# Patient Record
Sex: Female | Born: 1990 | Race: White | Hispanic: No | Marital: Single | Smoking: Never smoker
Health system: Southern US, Community
[De-identification: ages and names within clinical notes are randomized; demographics above are authoritative.]

## PROBLEM LIST (undated history)

## (undated) HISTORY — PX: CHOLECYSTECTOMY: SHX55

## (undated) HISTORY — PX: TUBAL LIGATION: SHX77

---

## 2021-03-09 ENCOUNTER — Other Ambulatory Visit: Payer: Self-pay

## 2021-03-09 ENCOUNTER — Encounter (HOSPITAL_BASED_OUTPATIENT_CLINIC_OR_DEPARTMENT_OTHER): Payer: Self-pay | Admitting: *Deleted

## 2021-03-09 ENCOUNTER — Emergency Department (HOSPITAL_BASED_OUTPATIENT_CLINIC_OR_DEPARTMENT_OTHER)
Admission: EM | Admit: 2021-03-09 | Discharge: 2021-03-10 | Disposition: A | Discharge status: Home / Self Care | Payer: Self-pay | Attending: Emergency Medicine | Admitting: Emergency Medicine

## 2021-03-09 ENCOUNTER — Emergency Department (HOSPITAL_BASED_OUTPATIENT_CLINIC_OR_DEPARTMENT_OTHER): Payer: Self-pay

## 2021-03-09 DIAGNOSIS — N939 Abnormal uterine and vaginal bleeding, unspecified: Secondary | ICD-10-CM | POA: Insufficient documentation

## 2021-03-09 DIAGNOSIS — R1030 Lower abdominal pain, unspecified: Secondary | ICD-10-CM | POA: Insufficient documentation

## 2021-03-09 DIAGNOSIS — R102 Pelvic and perineal pain: Secondary | ICD-10-CM | POA: Insufficient documentation

## 2021-03-09 LAB — WET PREP, GENITAL
Clue Cells Wet Prep HPF POC: NONE SEEN
Sperm: NONE SEEN
Trich, Wet Prep: NONE SEEN
Yeast Wet Prep HPF POC: NONE SEEN

## 2021-03-09 LAB — URINALYSIS, MICROSCOPIC (REFLEX): RBC / HPF: >50 RBC/hpf (ref 0–5)

## 2021-03-09 LAB — URINALYSIS, ROUTINE W REFLEX MICROSCOPIC

## 2021-03-09 LAB — PREGNANCY, URINE: Preg Test, Ur: NEGATIVE

## 2021-03-09 NOTE — ED Triage Notes (Signed)
Heavy vaginal bleeding x 84 days. Stopped bleeding x 4 days then started again. States she lives in [place]. Hx of abnormal vaginal bleeding that her MD is aware of. She has an [place] scheduled for a month.

## 2021-03-09 NOTE — ED Provider Notes (Signed)
[facility] EMERGENCY DEPARTMENT Provider Note   CSN: [id number] Arrival date & time: 03/09/21  2008     History Chief Complaint  Patient presents with   Vaginal Bleeding    Ashley Walsh is a 30 y.o. female presenting to the ED with a chief complaint of vaginal bleeding.  States that 4 days ago she started having heavy vaginal bleeding that has worsened since it began.  4 days prior to this she had no bleeding but 84 days prior to this she has had persistent vaginal bleeding.  She is seen her specialist several times but "they have not really done anything or started me on anything.  I am scheduled for an ultrasound in a month."  She states that she is going through several pads every few hours.  Reports some lower abdominal cramping.  Denies any vomiting, anticoagulant use, possibility of pregnancy.  No vaginal discharge.   Vaginal Bleeding Associated symptoms: no abdominal pain, no dizziness, no dysuria, no fever and no nausea       History reviewed. No pertinent past medical history.  There are no problems to display for this patient.   Past Surgical History:  Procedure Laterality Date   CESAREAN SECTION     CHOLECYSTECTOMY     TUBAL LIGATION       OB History   No obstetric history on file.     No family history on file.  Social History   Tobacco Use   Smoking status: Never   Smokeless tobacco: Never  Vaping Use   Vaping Use: Never used  Substance Use Topics   Alcohol use: Not Currently   Drug use: Never    Home Medications Prior to Admission medications   Not on File    Allergies    Percocet [oxycodone-acetaminophen]  Review of Systems   Review of Systems  Constitutional:  Negative for appetite change, chills and fever.  HENT:  Negative for ear pain, rhinorrhea, sneezing and sore throat.   Eyes:  Negative for photophobia and visual disturbance.  Respiratory:  Negative for cough, chest tightness, shortness of breath and wheezing.    Cardiovascular:  Negative for chest pain and palpitations.  Gastrointestinal:  Negative for abdominal pain, blood in stool, constipation, diarrhea, nausea and vomiting.  Genitourinary:  Positive for pelvic pain and vaginal bleeding. Negative for dysuria, hematuria and urgency.  Musculoskeletal:  Negative for myalgias.  Skin:  Negative for rash.  Neurological:  Negative for dizziness, weakness and light-headedness.   Physical Exam Updated Vital Signs BP 125/84 (BP Location: Right Arm)   Pulse (!) 109   Temp 99.7 F (37.6 C) (Oral)   Resp 17   Ht 5\' 7"  (1.702 m)   Wt 101.4 kg   SpO2 100%   BMI 35.01 kg/m   Physical Exam Vitals and nursing note reviewed.  Constitutional:      General: She is not in acute distress.    Appearance: She is well-developed.  HENT:     Head: Normocephalic and atraumatic.     Nose: Nose normal.  Eyes:     General: No scleral icterus.       Right eye: No discharge.        Left eye: No discharge.     Conjunctiva/sclera: Conjunctivae normal.  Cardiovascular:     Rate and Rhythm: Regular rhythm. Tachycardia present.     Heart sounds: Normal heart sounds. No murmur heard.   No friction rub. No gallop.  Pulmonary:  Effort: Pulmonary effort is normal. No respiratory distress.     Breath sounds: Normal breath sounds.  Abdominal:     General: Bowel sounds are normal. There is no distension.     Palpations: Abdomen is soft.     Tenderness: There is no abdominal tenderness. There is no guarding.  Genitourinary:    Vagina: Bleeding present. No vaginal discharge or tenderness.     Cervix: No cervical motion tenderness or erythema.     Uterus: Tender.   Musculoskeletal:        General: Normal range of motion.     Cervical back: Normal range of motion and neck supple.  Skin:    General: Skin is warm and dry.     Findings: No rash.  Neurological:     Mental Status: She is alert.     Motor: No abnormal muscle tone.     Coordination: Coordination  normal.    ED Results / Procedures / Treatments   Labs (all labs ordered are listed, but only abnormal results are displayed) Labs Reviewed  WET PREP, GENITAL - Abnormal; Notable for the following components:      Result Value   WBC, Wet Prep HPF POC FEW (*)    All other components within normal limits  URINALYSIS, ROUTINE W REFLEX MICROSCOPIC - Abnormal; Notable for the following components:   Color, Urine RED (*)    APPearance TURBID (*)    Glucose, UA   (*)    Value: TEST NOT REPORTED DUE TO COLOR INTERFERENCE OF URINE PIGMENT   Hgb urine dipstick   (*)    Value: TEST NOT REPORTED DUE TO COLOR INTERFERENCE OF URINE PIGMENT   Bilirubin Urine   (*)    Value: TEST NOT REPORTED DUE TO COLOR INTERFERENCE OF URINE PIGMENT   Ketones, ur   (*)    Value: TEST NOT REPORTED DUE TO COLOR INTERFERENCE OF URINE PIGMENT   Protein, ur   (*)    Value: TEST NOT REPORTED DUE TO COLOR INTERFERENCE OF URINE PIGMENT   Nitrite   (*)    Value: TEST NOT REPORTED DUE TO COLOR INTERFERENCE OF URINE PIGMENT   Leukocytes,Ua   (*)    Value: TEST NOT REPORTED DUE TO COLOR INTERFERENCE OF URINE PIGMENT   All other components within normal limits  URINALYSIS, MICROSCOPIC (REFLEX) - Abnormal; Notable for the following components:   Bacteria, UA RARE (*)    All other components within normal limits  PREGNANCY, URINE  BASIC METABOLIC PANEL  CBC WITH DIFFERENTIAL/PLATELET  GC/CHLAMYDIA PROBE AMP ([place]) NOT AT [facility]    EKG None  Radiology No results found.  Procedures Procedures   Medications Ordered in ED Medications - No data to display  ED Course  I have reviewed the triage vital signs and the nursing notes.  Pertinent labs & imaging results that were available during my care of the patient were reviewed by me and considered in my medical decision making (see chart for details).  Clinical Course as of 03/10/21 0001  Thu Mar 09, 2021  2337 WBC, Wet Prep HPF POC(!): FEW [HK]  2340 Preg  Test, Ur: NEGATIVE [HK]    Clinical Course User Index [HK] Dietrich Pates, PA-C   MDM Rules/Calculators/A&P                           30 year old female presenting to the ED for vaginal bleeding.  Gradually worsening vaginal bleeding for  the past 4 days.  She previously had bleeding for 84 days consecutively.  She has been seen by her specialist but "no one told me what is going on."  She is scheduled for a pelvic ultrasound in 1 month.  She has no new hormonal therapy.  Reports some lower abdominal discomfort.  On exam patient with bleeding noted on pelvic exam.  She has some uterine tenderness.  She is tachycardic here but otherwise hemodynamically stable.  Will obtain pelvic ultrasound and lab work and reassess.  Care handed off to oncoming provider pending remainder of workup and disposition.   Portions of this note were generated with Scientist, clinical (histocompatibility and immunogenetics). Dictation errors may occur despite best attempts at proofreading.  Final Clinical Impression(s) / ED Diagnoses Final diagnoses:  Pelvic pain  Abnormal uterine bleeding (AUB)    Rx / DC Orders ED Discharge Orders     None        Dietrich Pates, PA-C 03/10/21 0002    Tilden Fossa, MD 03/10/21 [phone]

## 2021-03-10 LAB — CBC WITH DIFFERENTIAL/PLATELET
Abs Immature Granulocytes: 0.01 10*3/uL (ref 0.00–0.07)
Basophils Absolute: 0 10*3/uL (ref 0.0–0.1)
Basophils Relative: 1 %
Eosinophils Absolute: 0.1 10*3/uL (ref 0.0–0.5)
Eosinophils Relative: 2 %
HCT: 41 % (ref 36.0–46.0)
Hemoglobin: 14.5 g/dL (ref 12.0–15.0)
Immature Granulocytes: 0 %
Lymphocytes Relative: 40 %
Lymphs Abs: 3 10*3/uL (ref 0.7–4.0)
MCH: 30.2 pg (ref 26.0–34.0)
MCHC: 35.4 g/dL (ref 30.0–36.0)
MCV: 85.4 fL (ref 80.0–100.0)
Monocytes Absolute: 0.6 10*3/uL (ref 0.1–1.0)
Monocytes Relative: 7 %
Neutro Abs: 3.9 10*3/uL (ref 1.7–7.7)
Neutrophils Relative %: 50 %
Platelets: 220 10*3/uL (ref 150–400)
RBC: 4.8 MIL/uL (ref 3.87–5.11)
RDW: 12.6 % (ref 11.5–15.5)
WBC: 7.6 10*3/uL (ref 4.0–10.5)
nRBC: 0 % (ref 0.0–0.2)

## 2021-03-10 LAB — BASIC METABOLIC PANEL
Anion gap: 6 (ref 5–15)
BUN: 14 mg/dL (ref 6–20)
CO2: 24 mmol/L (ref 22–32)
Calcium: 8.8 mg/dL — ABNORMAL LOW (ref 8.9–10.3)
Chloride: 106 mmol/L (ref 98–111)
Creatinine, Ser: 0.68 mg/dL (ref 0.44–1.00)
GFR, Estimated: >60 mL/min (ref >60)
Glucose, Bld: 139 mg/dL — ABNORMAL HIGH (ref 70–99)
Potassium: 3.6 mmol/L (ref 3.5–5.1)
Sodium: 136 mmol/L (ref 135–145)

## 2021-03-10 MED ORDER — SODIUM CHLORIDE 0.9 % IV BOLUS: 1000.0000 mL | Freq: Once | INTRAVENOUS | Status: DC

## 2021-03-13 LAB — GC/CHLAMYDIA PROBE AMP (~~LOC~~) NOT AT ARMC
Chlamydia: NEGATIVE
Comment: NEGATIVE
Comment: NORMAL
Neisseria Gonorrhea: NEGATIVE

## 2022-01-21 ENCOUNTER — Emergency Department (HOSPITAL_BASED_OUTPATIENT_CLINIC_OR_DEPARTMENT_OTHER): Payer: Medicaid Other

## 2022-01-21 ENCOUNTER — Other Ambulatory Visit: Payer: Self-pay

## 2022-01-21 ENCOUNTER — Emergency Department (HOSPITAL_BASED_OUTPATIENT_CLINIC_OR_DEPARTMENT_OTHER)
Admission: EM | Admit: 2022-01-21 | Discharge: 2022-01-21 | Disposition: A | Discharge status: Home / Self Care | Payer: Medicaid Other | Attending: Emergency Medicine | Admitting: Emergency Medicine

## 2022-01-21 ENCOUNTER — Encounter (HOSPITAL_BASED_OUTPATIENT_CLINIC_OR_DEPARTMENT_OTHER): Payer: Self-pay | Admitting: Emergency Medicine

## 2022-01-21 DIAGNOSIS — M545 Low back pain, unspecified: Secondary | ICD-10-CM | POA: Insufficient documentation

## 2022-01-21 LAB — URINALYSIS, ROUTINE W REFLEX MICROSCOPIC
Bilirubin Urine: NEGATIVE
Glucose, UA: NEGATIVE mg/dL
Hgb urine dipstick: NEGATIVE
Ketones, ur: NEGATIVE mg/dL
Leukocytes,Ua: NEGATIVE
Nitrite: NEGATIVE
Protein, ur: NEGATIVE mg/dL
Specific Gravity, Urine: 1.02 (ref 1.005–1.030)
pH: 5.5 (ref 5.0–8.0)

## 2022-01-21 LAB — PREGNANCY, URINE: Preg Test, Ur: NEGATIVE

## 2022-01-21 MED ORDER — IBUPROFEN 800 MG PO TABS: 800.0000 mg | ORAL_TABLET | Freq: Three times a day (TID) | ORAL | 0 refills | Status: AC

## 2022-01-21 MED ORDER — KETOROLAC TROMETHAMINE 15 MG/ML IJ SOLN
15.0000 mg | Freq: Once | INTRAMUSCULAR | Status: AC
  Administered 2022-01-21: 15 mg via INTRAMUSCULAR
  Filled 2022-01-21: qty 1

## 2022-01-21 MED ORDER — LIDOCAINE 5 % EX PTCH
1.0000 | MEDICATED_PATCH | CUTANEOUS | Status: DC
  Administered 2022-01-21: 1 via TRANSDERMAL
  Filled 2022-01-21: qty 1

## 2022-01-21 MED ORDER — LIDOCAINE 5 % EX PTCH: 1.0000 | MEDICATED_PATCH | CUTANEOUS | 0 refills | Status: AC

## 2022-01-21 NOTE — ED Provider Notes (Signed)
[facility] EMERGENCY DEPARTMENT Provider Note   CSN: [id number] Arrival date & time: 01/21/22  1358     History  Chief Complaint  Patient presents with   Back Pain    Ashley Walsh is a 31 y.o. female.   Back Pain    Patient with medical history of prior C-section, tubal ligation and cholecystectomy presents today due to lower back pain x2 days.  Denies any obvious onset of the pain, its worse whenever she ambulates or sits up.  The pain is diffuse across her lower back but worse in the middle.  Is worse when she raises her left lower extremity.  She denies any saddle anesthesia, dysuria, hematuria, urinary tension, fecal incontinence, bilateral lower extremity weakness or numbness.  No direct trauma to the back.  She has tried Tylenol and Motrin with minimal relief.  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  No previous surgeries to the back, no history of malignancy or IV drug use.  Home Medications Prior to Admission medications   Medication Sig Start Date End Date Taking? Authorizing Provider  ibuprofen (ADVIL) 800 MG tablet Take 1 tablet (800 mg total) by mouth 3 (three) times daily. 01/21/22  Yes Donyel Nester, Rolly Salter, PA-C  lidocaine (LIDODERM) 5 % Place 1 patch onto the skin daily. Remove & Discard patch within 12 hours or as directed by MD 01/21/22  Yes Theron Arista, PA-C      Allergies    Percocet [oxycodone-acetaminophen]    Review of Systems   Review of Systems  Musculoskeletal:  Positive for back pain.    Physical Exam Updated Vital Signs BP 136/82 (BP Location: Right Arm)   Pulse 69   Temp 98.1 F (36.7 C) (Oral)   Resp 18   Ht 5\' 7"  (1.702 m)   Wt 95.3 kg   LMP 01/07/2022   SpO2 99%   BMI 32.89 kg/m  Physical Exam Vitals and nursing note reviewed. Exam conducted with a chaperone present.  Constitutional:      Appearance: Normal appearance.  HENT:     Head: Normocephalic and  atraumatic.  Eyes:     General: No scleral icterus.       Right eye: No discharge.        Left eye: No discharge.     Extraocular Movements: Extraocular movements intact.     Pupils: Pupils are equal, round, and reactive to light.  Cardiovascular:     Rate and Rhythm: Normal rate and regular rhythm.     Pulses: Normal pulses.     Heart sounds: Normal heart sounds. No murmur heard.    No friction rub. No gallop.  Pulmonary:     Effort: Pulmonary effort is normal. No respiratory distress.     Breath sounds: Normal breath sounds.  Abdominal:     General: Abdomen is flat. Bowel sounds are normal. There is no distension.     Palpations: Abdomen is soft.     Tenderness: There is no abdominal tenderness.  Musculoskeletal:        General: Tenderness present.     Comments: Diffuse lower back pain paraspinally to the of the lumbar.  Midline tenderness to L1-L5 without any crepitus or palpable step-offs.  Skin:    General: Skin is warm and dry.     Coloration: Skin is not jaundiced.  Neurological:     Mental Status: She is alert. Mental status is at baseline.  Coordination: Coordination normal.     Comments: Cranial nerves II through XII are grossly intact.  Grip strength is equal bilaterally.  Lower extremity strength is symmetric bilaterally.     ED Results / Procedures / Treatments   Labs (all labs ordered are listed, but only abnormal results are displayed) Labs Reviewed  URINALYSIS, ROUTINE W REFLEX MICROSCOPIC - Abnormal; Notable for the following components:      Result Value   Color, Urine STRAW (*)    APPearance CLOUDY (*)    All other components within normal limits  PREGNANCY, URINE    EKG None  Radiology DG Lumbar Spine Complete  Result Date: 01/21/2022 CLINICAL DATA:  Left side back pain EXAM: LUMBAR SPINE - COMPLETE 4+ VIEW COMPARISON:  None Available. FINDINGS: Normal alignment.  Disc spaces maintained.  No fracture. IMPRESSION: Negative. Electronically Signed    By: Charlett Nose M.D.   On: 01/21/2022 17:09    Procedures Procedures    Medications Ordered in ED Medications  lidocaine (LIDODERM) 5 % 1 patch (1 patch Transdermal Patch Applied 01/21/22 1633)  ketorolac (TORADOL) 15 MG/ML injection 15 mg (15 mg Intramuscular Given 01/21/22 1632)    ED Course/ Medical Decision Making/ A&P                           Medical Decision Making Amount and/or Complexity of Data Reviewed Labs: ordered. Radiology: ordered.  Risk Prescription drug management.   Patient presents due to low back pain.  Differential includes but not limited to fracture, muscle sprain, cord compression, cauda equina, epidural abscess.  On exam patient has no focal deficits.  She is ambulatory steady gait, vitals are stable she is afebrile and there is no risk factors for epidural abscess.  No recent trauma that be tested for cauda equina or cord compression.  I ordered, viewed and interpreted plain film which shows no acute process.  We discussed the results of the x-ray.  I ordered her Lidoderm patch and Toradol which improved the pain.  Consider additional imaging but I suspect her pain is most likely muscle.  Is only been going on for 2 days and I do not think additional imaging would be needed.  Patient discharged stable condition, advised Lidoderm, anti-inflammatory medicine and follow-up with PCP.  She verbalized understanding and agreement with plan.  Return precautions were discussed.        Final Clinical Impression(s) / ED Diagnoses Final diagnoses:  Acute bilateral low back pain without sciatica    Rx / DC Orders ED Discharge Orders          Ordered    ibuprofen (ADVIL) 800 MG tablet  3 times daily        01/21/22 1718    lidocaine (LIDODERM) 5 %  Every 24 hours        01/21/22 1718              Theron Arista, PA-C 01/21/22 1750    Lonell Grandchild, MD 01/22/22 1321

## 2022-01-21 NOTE — ED Triage Notes (Signed)
Pt c/o left sided low back pain onset yesterday. Pt denies recent injury.

## 2022-01-21 NOTE — ED Notes (Signed)
Pain in lower back. Patient able to ambulate

## 2022-01-21 NOTE — Discharge Instructions (Signed)
He was seen today in the emergency department for low back pain.  Your x-ray was negative for any acute process, and we suspect you have a muscle sprain.  Take ibuprofen 3 times daily with food and water.  Use the Lidoderm patch every 12 hours.  Return to the ED if you have inability to urinate, loss of bladder or bowel function, weakness on both lower extremities.

## 2022-03-15 IMAGING — US US PELVIS COMPLETE TRANSABD/TRANSVAG W DUPLEX
1 series · 13 of 25 positions shown · non-contrast
Comparison: None available.

CLINICAL DATA: Initial evaluation for acute pelvic pain, vaginal
bleeding.

EXAM:
TRANSABDOMINAL AND TRANSVAGINAL ULTRASOUND OF PELVIS
DOPPLER ULTRASOUND OF OVARIES
TECHNIQUE: Both transabdominal and transvaginal ultrasound examinations of the
pelvis were performed. Transabdominal technique was performed for
global imaging of the pelvis including uterus, ovaries, adnexal
regions, and pelvic cul-de-sac.
It was necessary to proceed with endovaginal exam following the
transabdominal exam to visualize the uterus, endometrium, and
ovaries. Color and duplex Doppler ultrasound was utilized to
evaluate blood flow to the ovaries.

[Series 1: us pelvis complete transabd/transvag w duplex · 13 of 109 slices shown]
[im 1/109]
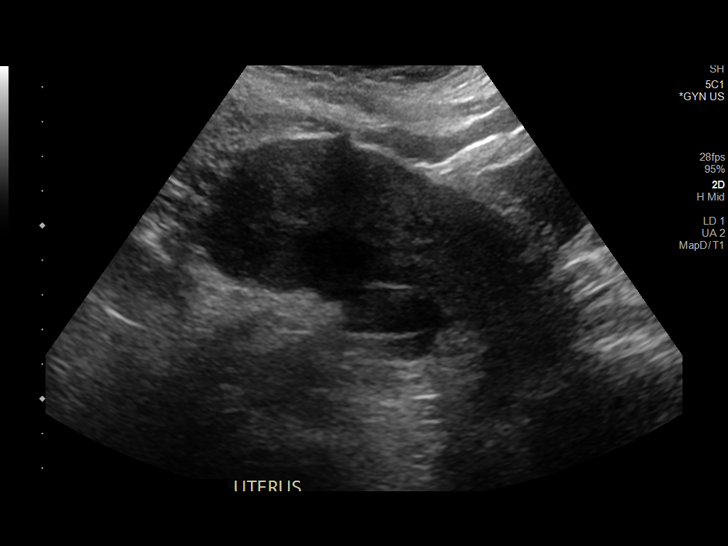
[im 10/109]
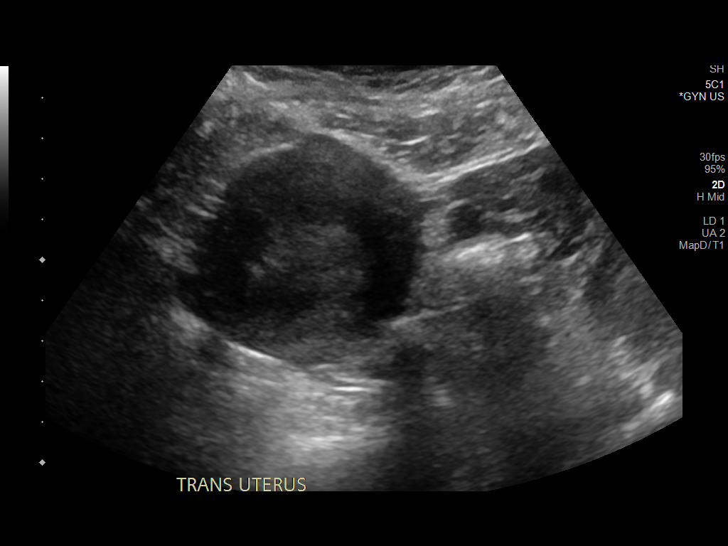
[im 19/109]
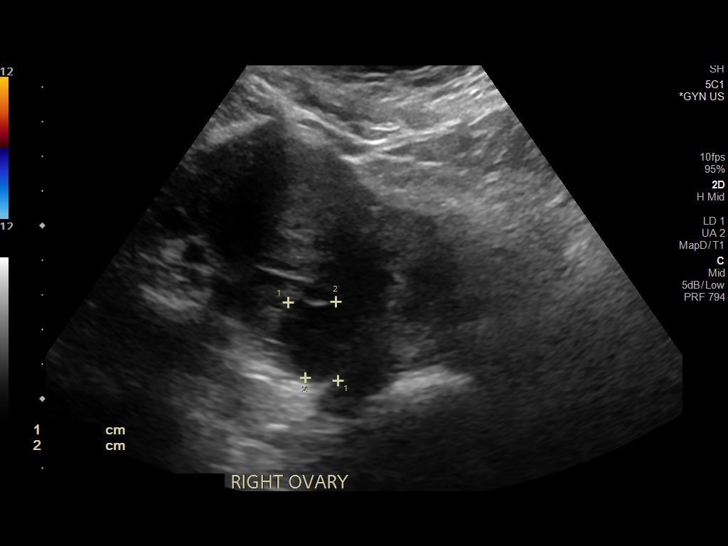
[im 28/109]
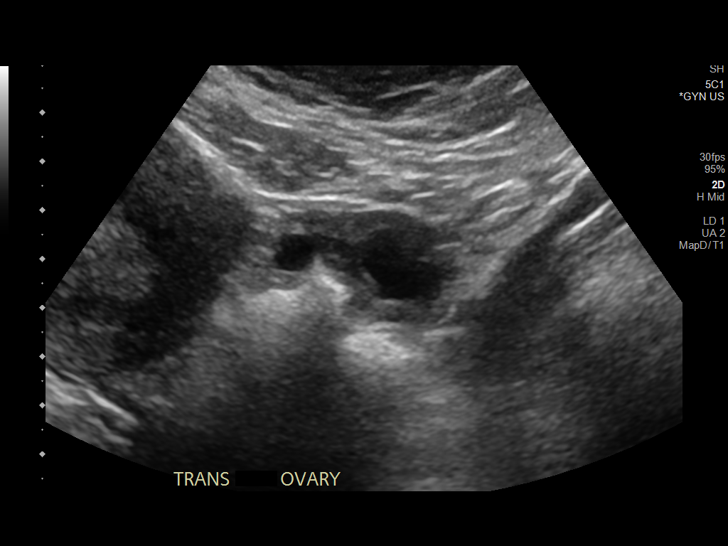
[im 37/109]
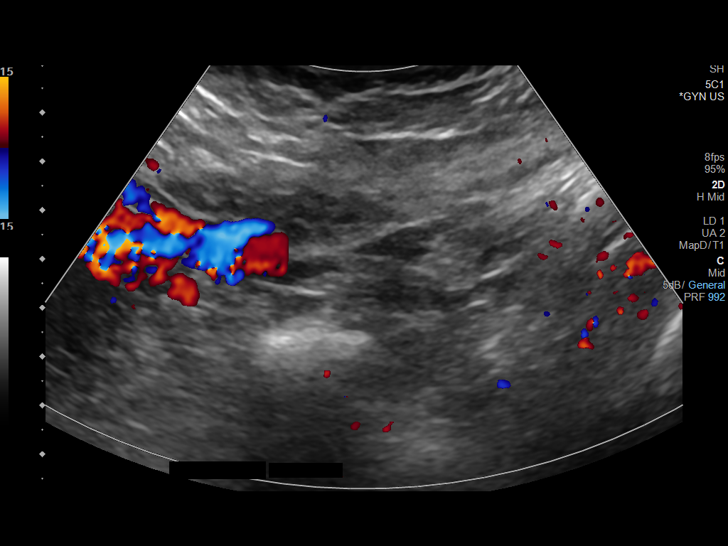
[im 46/109]
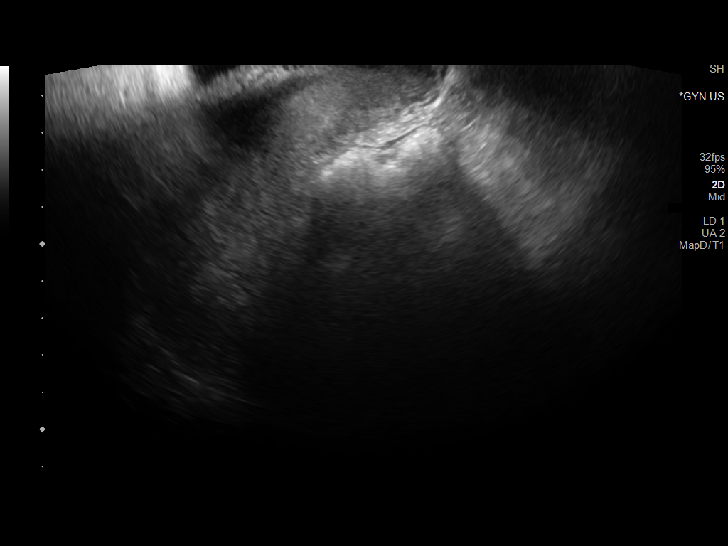
[im 55/109]
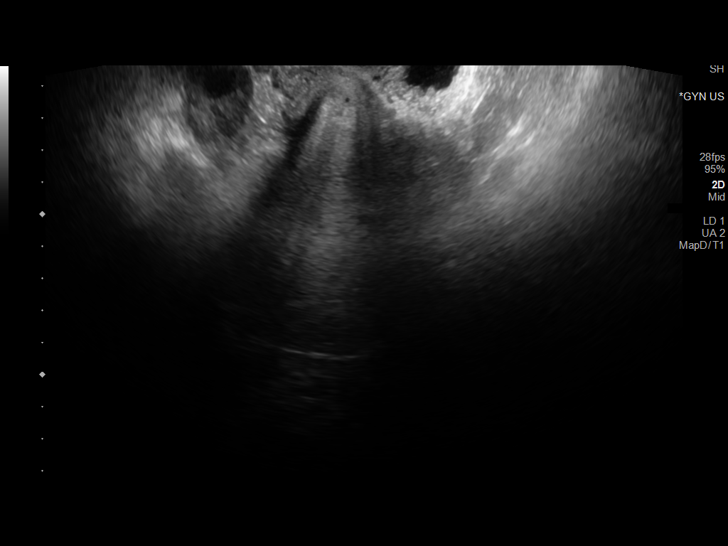
[im 64/109]
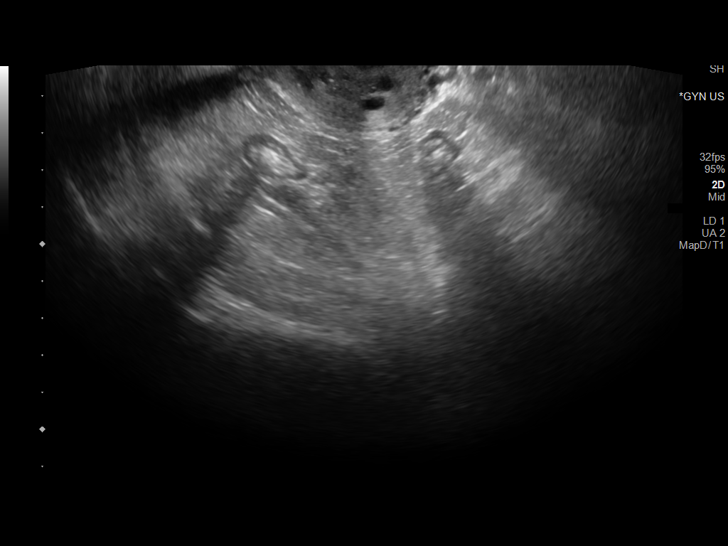
[im 73/109]
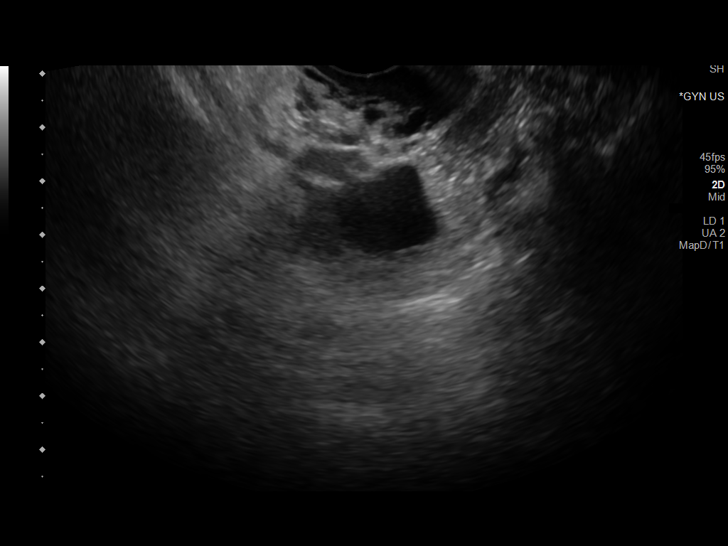
[im 82/109]
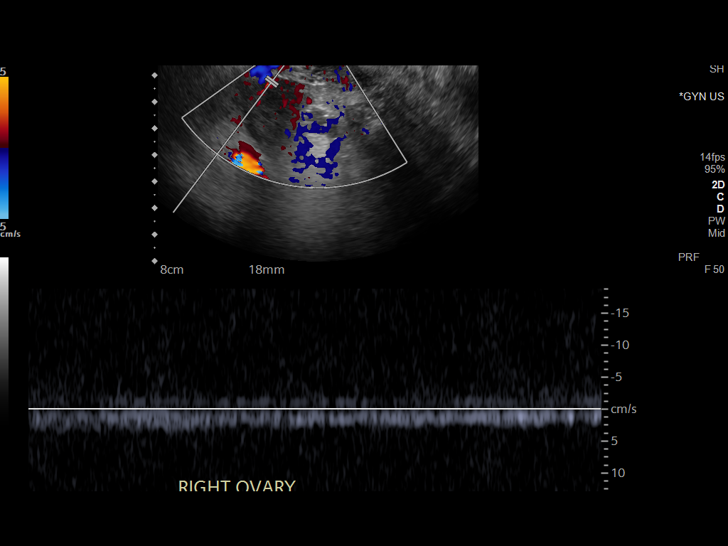
[im 91/109]
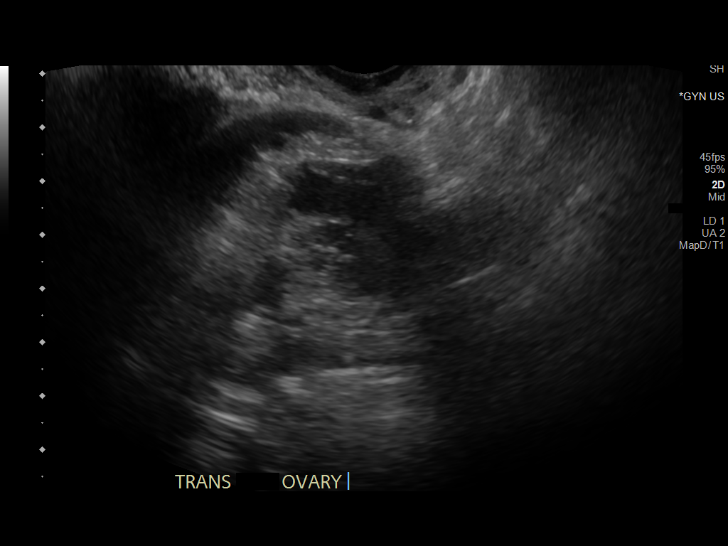
[im 100/109]
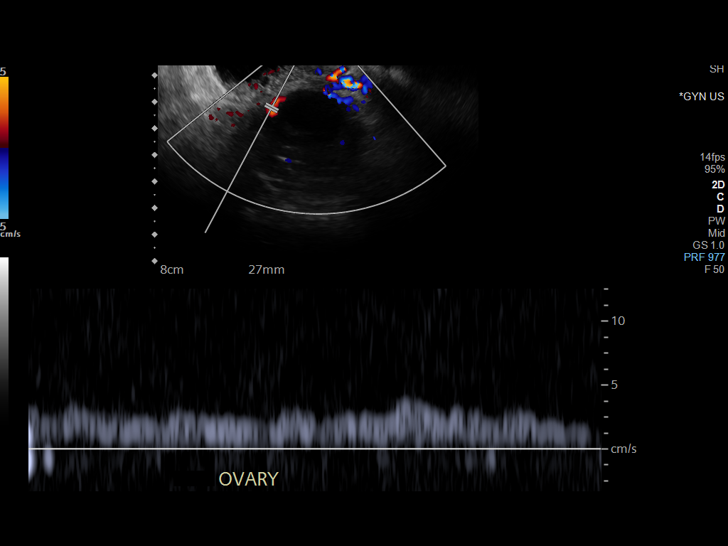
[im 109/109]
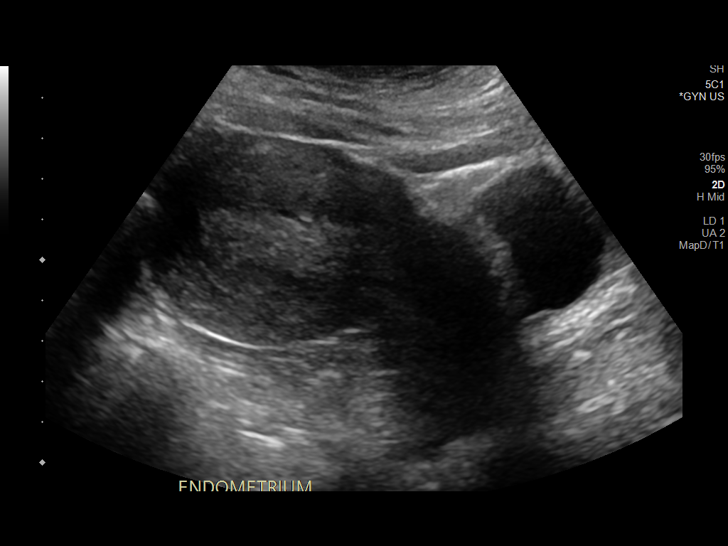

[13 of 25 positions shown; findings below may reference images not displayed]

FINDINGS: Uterus

Measurements: 10.8 x 5.7 x 5.8 cm = volume: 184 mL. Uterus is
anteverted. No discrete fibroid or other mass.

Endometrium

Thickness: 24 mm.  No focal abnormality or abnormal vascularity.

Right ovary

Measurements: 3.5 x 2.9 x 2.5 cm = volume: 13.3 mL. 2.2 cm dominant
follicle. No adnexal mass.

Left ovary

Measurements: 4.2 x 3.5 x 2.4 cm = volume: 18.0 mL. 3.2 x 2.4 x
cm simple cyst. No internal complexity, vascularity, or solid
component.

Pulsed Doppler evaluation of both ovaries demonstrates normal
low-resistance arterial and venous waveforms.

Other findings

No abnormal free fluid.
IMPRESSION: 1. Endometrial stripe thickened up to 24 mm. If bleeding remains
unresponsive to hormonal or medical therapy, focal lesion work-up
with sonohysterogram should be considered. Endometrial biopsy should
also be considered in pre-menopausal patients at high risk for
endometrial carcinoma. (Ref: Radiological Reasoning: Algorithmic
Workup of Abnormal Vaginal Bleeding with Endovaginal Sonography and
Sonohysterography. AJR 8335; 191:S68-73).
2. 3.2 cm simple left ovarian cyst, likely a benign follicular cyst.
No follow up imaging recommended. Note: This recommendation does not
apply to premenarchal patients or to those with increased risk
(genetic, family history, elevated tumor markers or other high-risk
factors) of ovarian cancer. Reference: Radiology [DATE]. No other acute abnormality within the pelvis. No evidence for
torsion.
